# Patient Record
Sex: Female | Born: 1980 | Race: Black or African American | Marital: Single | Smoking: Current every day smoker
Health system: Southern US, Community
[De-identification: ages and names within clinical notes are randomized; demographics above are authoritative.]

---

## 2013-07-03 ENCOUNTER — Emergency Department (HOSPITAL_COMMUNITY)
Admission: EM | Admit: 2013-07-03 | Discharge: 2013-07-03 | Disposition: A | Discharge status: Home / Self Care | Payer: No Typology Code available for payment source | Attending: Emergency Medicine | Admitting: Emergency Medicine

## 2013-07-03 ENCOUNTER — Emergency Department (HOSPITAL_COMMUNITY): Payer: No Typology Code available for payment source

## 2013-07-03 ENCOUNTER — Encounter (HOSPITAL_COMMUNITY): Payer: Self-pay | Admitting: Emergency Medicine

## 2013-07-03 DIAGNOSIS — Y9389 Activity, other specified: Secondary | ICD-10-CM | POA: Insufficient documentation

## 2013-07-03 DIAGNOSIS — S0990XA Unspecified injury of head, initial encounter: Secondary | ICD-10-CM | POA: Insufficient documentation

## 2013-07-03 DIAGNOSIS — F17200 Nicotine dependence, unspecified, uncomplicated: Secondary | ICD-10-CM | POA: Insufficient documentation

## 2013-07-03 DIAGNOSIS — IMO0002 Reserved for concepts with insufficient information to code with codable children: Secondary | ICD-10-CM | POA: Insufficient documentation

## 2013-07-03 DIAGNOSIS — Y92410 Unspecified street and highway as the place of occurrence of the external cause: Secondary | ICD-10-CM | POA: Insufficient documentation

## 2013-07-03 DIAGNOSIS — S298XXA Other specified injuries of thorax, initial encounter: Secondary | ICD-10-CM | POA: Insufficient documentation

## 2013-07-03 DIAGNOSIS — S139XXA Sprain of joints and ligaments of unspecified parts of neck, initial encounter: Secondary | ICD-10-CM | POA: Insufficient documentation

## 2013-07-03 DIAGNOSIS — S161XXA Strain of muscle, fascia and tendon at neck level, initial encounter: Secondary | ICD-10-CM

## 2013-07-03 MED ORDER — CYCLOBENZAPRINE HCL 10 MG PO TABS: 10.0000 mg | ORAL_TABLET | Freq: Two times a day (BID) | ORAL | Status: AC | PRN

## 2013-07-03 MED ORDER — NAPROXEN 500 MG PO TABS: 500.0000 mg | ORAL_TABLET | Freq: Two times a day (BID) | ORAL | Status: AC

## 2013-07-03 MED ORDER — IBUPROFEN 400 MG PO TABS
800.0000 mg | ORAL_TABLET | Freq: Once | ORAL | Status: AC
  Administered 2013-07-03: 800 mg via ORAL
  Filled 2013-07-03: qty 2

## 2013-07-03 MED ORDER — TRAMADOL HCL 50 MG PO TABS: 50.0000 mg | ORAL_TABLET | Freq: Four times a day (QID) | ORAL | Status: AC | PRN
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
## 2013-07-03 NOTE — Discharge Instructions (Signed)
Cervical Sprain °A cervical sprain is an injury in the neck in which the strong, fibrous tissues (ligaments) that connect your neck bones stretch or tear. Cervical sprains can range from mild to severe. Severe cervical sprains can cause the neck vertebrae to be unstable. This can lead to damage of the spinal cord and can result in serious nervous system problems. The amount of time it takes for a cervical sprain to get better depends on the cause and extent of the injury. Most cervical sprains heal in 1 to 3 weeks. °CAUSES  °Severe cervical sprains may be caused by:  °· Contact sport injuries (such as from football, rugby, wrestling, hockey, auto racing, gymnastics, diving, martial arts, or boxing).   °· Motor vehicle collisions.   °· Whiplash injuries. This is an injury from a sudden forward-and backward whipping movement of the head and neck.  °· Falls.   °Mild cervical sprains may be caused by:  °· Being in an awkward position, such as while cradling a telephone between your ear and shoulder.   °· Sitting in a chair that does not offer proper support.   °· Working at a poorly designed computer station.   °· Looking up or down for long periods of time.   °SYMPTOMS  °· Pain, soreness, stiffness, or a burning sensation in the front, back, or sides of the neck. This discomfort may develop immediately after the injury or slowly, 24 hours or more after the injury.   °· Pain or tenderness directly in the middle of the back of the neck.   °· Shoulder or upper back pain.   °· Limited ability to move the neck.   °· Headache.   °· Dizziness.   °· Weakness, numbness, or tingling in the hands or arms.   °· Muscle spasms.   °· Difficulty swallowing or chewing.   °· Tenderness and swelling of the neck.   °DIAGNOSIS  °Most of the time your health care provider can diagnose a cervical sprain by taking your history and doing a physical exam. Your health care provider will ask about previous neck injuries and any known neck  problems, such as arthritis in the neck. X-rays may be taken to find out if there are any other problems, such as with the bones of the neck. Other tests, such as a CT scan or MRI, may also be needed.  °TREATMENT  °Treatment depends on the severity of the cervical sprain. Mild sprains can be treated with rest, keeping the neck in place (immobilization), and pain medicines. Severe cervical sprains are immediately immobilized. Further treatment is done to help with pain, muscle spasms, and other symptoms and may include: °· Medicines, such as pain relievers, numbing medicines, or muscle relaxants.   °· Physical therapy. This may involve stretching exercises, strengthening exercises, and posture training. Exercises and improved posture can help stabilize the neck, strengthen muscles, and help stop symptoms from returning.   °HOME CARE INSTRUCTIONS  °· Put ice on the injured area.   °· Put ice in a plastic bag.   °· Place a towel between your skin and the bag.   °· Leave the ice on for 15 20 minutes, 3 4 times a day.   °· If your injury was severe, you may have been given a cervical collar to wear. A cervical collar is a two-piece collar designed to keep your neck from moving while it heals. °· Do not remove the collar unless instructed by your health care provider. °· If you have long hair, keep it outside of the collar. °· Ask your health care provider before making any adjustments to your collar.   Minor adjustments may be required over time to improve comfort and reduce pressure on your chin or on the back of your head.  Ifyou are allowed to remove the collar for cleaning or bathing, follow your health care provider's instructions on how to do so safely.  Keep your collar clean by wiping it with mild soap and water and drying it completely. If the collar you have been given includes removable pads, remove them every 1 2 days and hand wash them with soap and water. Allow them to air dry. They should be completely  dry before you wear them in the collar.  If you are allowed to remove the collar for cleaning and bathing, wash and dry the skin of your neck. Check your skin for irritation or sores. If you see any, tell your health care provider.  Do not drive while wearing the collar.   Only take over-the-counter or prescription medicines for pain, discomfort, or fever as directed by your health care provider.   Keep all follow-up appointments as directed by your health care provider.   Keep all physical therapy appointments as directed by your health care provider.   Make any needed adjustments to your workstation to promote good posture.   Avoid positions and activities that make your symptoms worse.   Warm up and stretch before being active to help prevent problems.  SEEK MEDICAL CARE IF:   Your pain is not controlled with medicine.   You are unable to decrease your pain medicine over time as planned.   Your activity level is not improving as expected.  SEEK IMMEDIATE MEDICAL CARE IF:   You develop any bleeding.  You develop stomach upset.  You have signs of an allergic reaction to your medicine.   Your symptoms get worse.   You develop new, unexplained symptoms.   You have numbness, tingling, weakness, or paralysis in any part of your body.  MAKE SURE YOU:   Understand these instructions.  Will watch your condition.  Will get help right away if you are not doing well or get worse. Document Released: 12/13/2006 Document Revised: 12/06/2012 Document Reviewed: 08/23/2012 [facility] Patient Information 2014 [place], [place].  Emergency Department Resource Guide 1) Find a Doctor and Pay Out of Pocket Although you won't have to find out who is covered by your insurance plan, it is a good idea to ask around and get recommendations. You will then need to call the office and see if the doctor you have chosen will accept you as a new patient and what types of options they  offer for patients who are self-pay. Some doctors offer discounts or will set up payment plans for their patients who do not have insurance, but you will need to ask so you aren't surprised when you get to your appointment.  2) Contact Your Local Health Department Not all health departments have doctors that can see patients for sick visits, but many do, so it is worth a call to see if yours does. If you don't know where your local health department is, you can check in your phone book. The CDC also has a tool to help you locate your state's health department, and many state websites also have listings of all of their local health departments.  3) Find a Walk-in Clinic If your illness is not likely to be very severe or complicated, you may want to try a walk in clinic. These are popping up all over the country in pharmacies, drugstores, and  shopping centers. They're usually staffed by nurse practitioners or physician assistants that have been trained to treat common illnesses and complaints. They're usually fairly quick and inexpensive. However, if you have serious medical issues or chronic medical problems, these are probably not your best option.  No Primary Care Doctor: - Call Health Connect at  [phone] - they can help you locate a primary care doctor that  accepts your insurance, provides certain services, etc. - Physician Referral Service- [phone]  Chronic Pain Problems: Organization         Address  Phone   Notes  [facility]  [phone] Patients need to be referred by their primary care doctor.   Medication Assistance: Organization         Address  Phone   Notes  [facility] Medication [facility] [address] [place], [place] [address] [phone]) [phone] --Must be a resident of [facility] -- Must have NO insurance coverage whatsoever (no Medicaid/ Medicare, etc.) -- The pt. MUST have a primary care doctor that directs their care  regularly and follows them in the community   MedAssist  [phone]   [employer]  [phone]) [phone]    Agencies that provide inexpensive medical care: Organization         Address  Phone   Notes  [facility]  [phone]   [facility]    [phone]   [facility] [address] [place], [place] [address] [phone]   [facility] [place] [address], [place] [phone]   Planned Parenthood    [phone]   [facility]    [phone]) [phone]   [facility] and [facility]  [address], [place] Phone:  [phone], Fax:  [phone]) [phone] Hours of Operation:  9 am - 6 pm, M-F.  Also accepts Medicaid/Medicare and self-pay.  [facility]  [address], [place] Phone: [phone], Fax: [phone]. Hours of Operation:  8:30 am - 5:30 pm, M-F.  Also accepts Medicaid and self-pay.  [facility] [place] [address], [place] Phone: [phone]) [phone]   [facility] [address], [place], [place] [phone], Ext. [phone] Mondays & Thursdays: 7-9 AM.  First 15 patients are seen on a first come, first serve basis.    Medicaid-accepting [facility] Providers:  Organization         Address  Phone   Notes  [facility] [address], [place] [phone] Also accepts self-pay patients.  [facility] [address], [place]  [phone]) [phone]   [facility] [address], [place] [phone]) [phone]   [facility] [address] [place] [address], [place] [phone]) [phone]   Renaye RakersVeita Bland [address], [place]   [phone] Only accepts [place] Access [place] patients after they have their name applied to their card.   Self-Pay (no insurance) in [facility]:  Organization         Address  Phone    Notes  Sickle Cell Patients, [facility] [address], [place] [phone]   [facility] [address], [place] [phone]   [facility] [place]  [address], [place] [phone]  [facility]/Dr. Osei-Bonsu  [address] [place] or [address], [place] [phone] Phone number for both [place] and [place] locations is the same.  [facility]gent [facility] and [facility] [address] [place] [phone]   [facility] [address] [place] or [address] [phone]   [facility] [address], [place] [phone], phone; [phone], fax Sees patients 1st and 3rd Saturday of every month.  Must not qualify for public or private insurance (i.e. Medicaid, Medicare, [place] Health Choice, Veterans' Benefits)  Household income should be no more than 200% of the poverty level The clinic cannot treat you if you are pregnant or think you are pregnant  Sexually transmitted diseases are not treated at the clinic.    Dental Care: Organization         Address  Phone  Notes  [facility] of [facility] [place] [phone] Accepts children up to age 37 who are enrolled in [place] or [place]; pregnant women with a Medicaid card; and children who have applied for Medicaid or [place] Health Choice, but were declined, whose parents can pay a reduced fee at time of service.  [facility] Department of [facility]  [address], [place] [phone] Accepts children up to age 42 who are enrolled in [place] or [place]; pregnant women with a Medicaid card; and children who have applied for Medicaid or [place] Health Choice, but were declined, whose parents can pay a reduced fee at time of service.  [place]  Adult Dental Access PROGRAM  [place] [phone] Patients are seen by appointment only. Walk-ins are not accepted. [place] will see patients 32 years of age and older. Monday - Tuesday (8am-5pm) Most Wednesdays (8:30-5pm) $30 per visit, cash only  [facility] Adult Dental Access PROGRAM  [address], [facility] [phone] Patients are seen by appointment only. Walk-ins are not accepted. [place] will see patients 57 years of age and older. One Wednesday Evening (Monthly: Volunteer Based).  $30 per visit, cash only  [place]  [phone] for adults; Children under age 23, call Graduate Pediatric Dentistry at [phone]. Children aged 28-14, please call [phone] to request a pediatric application.  Dental services are provided in all areas of dental care including fillings, crowns and bridges, complete and partial dentures, implants, gum treatment, root canals, and extractions. Preventive care is also provided. Treatment is provided to both adults and children. Patients are selected via a lottery and there is often a waiting list.   [facility] [address], [place]  [phone] [email or web address]   [facility] [address] [place], [place] [phone], Ext. [phone] Second and Fourth Thursday of each month, opens at 6:30 AM; Clinic ends at 9 AM.  Patients are seen on a first-come first-served basis, and a limited number are seen during each clinic.   [facility]  [address] Hillard Danker [place], [place] [phone]   Eligibility Requirements You must have lived in [place], [place], or [place] counties for at least the last three months.   You cannot be eligible for state or federal sponsored [employer], including [employer], [place], or [employer].   You generally cannot be eligible for healthcare insurance through  your employer.    How to  apply: Eligibility screenings are held every Tuesday and Wednesday afternoon from 1:00 pm until 4:00 pm. You do not need an appointment for the interview!  [facility] [address] [place], [place]   [place]  [place] Department  [place]  [phone]    Behavioral Health Resources in the Community: Intensive Outpatient Programs Organization         Address  Phone  Notes  [place]. [address] [place], [place] [phone]   [facility]patient [address], [place], [place]   ADS: Alcohol & Drug Svcs [address], [place], [place]   [place] [address],  [place], [place] or [phone]   Substance Abuse Resources Organization         Address  Phone  Notes  Alcohol and Drug Services  [phone]   [place]  [phone]   The [place]   Chinita Pester  [phone]   Residential & Outpatient Substance Abuse Program  [phone]   Psychological Services Organization         Address  Phone  Notes  [facility]  [place]  [phone]   [place] [address] [place] or [phone]    Mobile Crisis Teams Organization         Address  Phone  Notes  [facility], Mobile Crisis Care Unit  [phone]   [facility]ive Psychotherapeutic Services  [address] [place], [place]   [place] [address] [place] [phone]    Self-Help/Support Groups Organization         Address  Phone             Notes  [place] of [place] - variety of support groups  [place] Call for more information  Narcotics Anonymous (NA), Caring Services [address], [place]  2 meetings at this location   Special educational needs teacher         Address  Phone  Notes  ASAP Residential Treatment [place],    [place]  [phone]   [facility]  [address] [place] [id number], [place], [place]   [place], [place] [phone] Admissions: 8am-3pm M-F  Incentives Substance [place] [address],    [place], [place] [phone]   [facility] [address] [place], [place], [place]   [facility] [address],  [place], [place]   Insight Programs - Intensive Outpatient [address], [place], [place]   [facility] ([place].,  [place], [place] [phone] or [phone]   [facility]idential Treatment Services (RTS) [address] [place], [place] Accepts Medicaid  [facility] [place] [address],  [place] [phone] Substance Abuse/Addiction Treatment   [facility] Organization         Address  Phone  Notes  [facility]  [phone]   Domenic Schwab, PhD [address] [place], [place]   [phone] or [phone]   Helper Mexia [place], [place] [phone]  [facility] [address], [place], [place] [phone]) [phone] Insurance/Medicaid/sponsorship through [employer]Centerpoint  Faith and Families [address]                                    [place], [place] [phone]) [phone] Therapy/tele-psych/case  [facility] [address]   [place], [place] [phone](336) [phone]    Dr. Lolly MustacheArfeen  [phone](336) [phone]   [facility] [place]  [employer] [facility] 1) [address], [place] 2) [address], [place] 3)  [address], [place] [phone]) [phone]  [phone](336) [phone]   [facility] Child Abuse Hotline  [phone]) [phone] or [phone]) [phone] (After Hours)

## 2013-07-03 NOTE — ED Notes (Signed)
Pt states she has Percocet and Ibuprofen meds at home.  Percocet makes her sleepy.  Lat took Ibuprofen @ 9am this morning.  PA aware, orders obtained.
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
## 2013-07-10 NOTE — ED Provider Notes (Signed)
Medical screening examination/treatment/procedure(s) were performed by non-physician practitioner and as supervising physician I was immediately available for consultation/collaboration.   EKG Interpretation None        Lesle Faron, MD 07/10/13 1948 
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
